# Patient Record
Sex: Male | Born: 1985 | Race: White | Hispanic: No | Marital: Married | Smoking: Former smoker
Health system: Southern US, Community
[De-identification: ages and names within clinical notes are randomized; demographics above are authoritative.]

## PROBLEM LIST (undated history)

## (undated) DIAGNOSIS — K5900 Constipation, unspecified: Secondary | ICD-10-CM

## (undated) DIAGNOSIS — G039 Meningitis, unspecified: Secondary | ICD-10-CM

## (undated) DIAGNOSIS — K219 Gastro-esophageal reflux disease without esophagitis: Secondary | ICD-10-CM

## (undated) HISTORY — DX: Meningitis, unspecified: G03.9

## (undated) HISTORY — DX: Constipation, unspecified: K59.00

## (undated) HISTORY — DX: Gastro-esophageal reflux disease without esophagitis: K21.9

---
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
## 2005-11-19 ENCOUNTER — Emergency Department (HOSPITAL_COMMUNITY): Admission: EM | Admit: 2005-11-19 | Discharge: 2005-11-20 | Payer: Self-pay | Admitting: Emergency Medicine

## 2005-11-26 ENCOUNTER — Emergency Department (HOSPITAL_COMMUNITY): Admission: EM | Admit: 2005-11-26 | Discharge: 2005-11-26 | Payer: Self-pay | Admitting: Emergency Medicine

## 2006-09-30 ENCOUNTER — Emergency Department (HOSPITAL_COMMUNITY): Admission: EM | Admit: 2006-09-30 | Discharge: 2006-09-30 | Payer: Self-pay | Admitting: Emergency Medicine

## 2010-10-13 ENCOUNTER — Other Ambulatory Visit: Payer: Self-pay | Admitting: Internal Medicine

## 2010-10-13 ENCOUNTER — Ambulatory Visit (HOSPITAL_COMMUNITY)
Admission: RE | Admit: 2010-10-13 | Discharge: 2010-10-13 | Disposition: A | Discharge status: Home / Self Care | Payer: 59 | Source: Ambulatory Visit | Attending: Internal Medicine | Admitting: Internal Medicine

## 2010-10-13 DIAGNOSIS — R079 Chest pain, unspecified: Secondary | ICD-10-CM | POA: Insufficient documentation

## 2010-10-13 DIAGNOSIS — F17200 Nicotine dependence, unspecified, uncomplicated: Secondary | ICD-10-CM | POA: Insufficient documentation

## 2011-02-11 LAB — CBC
HCT: 44.2
Hemoglobin: 15.1
MCV: 87.7
Platelets: 215
RBC: 5.04
RDW: 12.9
WBC: 13.3 — ABNORMAL HIGH

## 2011-02-11 LAB — DIFFERENTIAL
Basophils Absolute: 0.1
Lymphs Abs: 3.1
Neutrophils Relative %: 66

## 2011-09-19 ENCOUNTER — Ambulatory Visit (INDEPENDENT_AMBULATORY_CARE_PROVIDER_SITE_OTHER): Payer: 59 | Admitting: Family Medicine

## 2011-09-19 VITALS — BP 119/70 | HR 105 | Temp 98.8°F | Resp 18 | Ht 71.5 in | Wt 192.8 lb

## 2011-09-19 DIAGNOSIS — L02219 Cutaneous abscess of trunk, unspecified: Secondary | ICD-10-CM

## 2011-09-19 DIAGNOSIS — L02419 Cutaneous abscess of limb, unspecified: Secondary | ICD-10-CM

## 2011-09-19 DIAGNOSIS — L03119 Cellulitis of unspecified part of limb: Secondary | ICD-10-CM

## 2011-09-19 DIAGNOSIS — L03319 Cellulitis of trunk, unspecified: Secondary | ICD-10-CM

## 2011-09-19 DIAGNOSIS — R509 Fever, unspecified: Secondary | ICD-10-CM

## 2011-09-19 LAB — POCT CBC
Granulocyte percent: 70 %G (ref 37–80)
HCT, POC: 43.8 % (ref 43.5–53.7)
MCH, POC: 29.9 pg (ref 27–31.2)
MCHC: 33.1 g/dL (ref 31.8–35.4)
MID (cbc): 0.9 (ref 0–0.9)
MPV: 10.5 fL (ref 0–99.8)

## 2011-09-19 MED ORDER — HYDROCODONE-ACETAMINOPHEN 5-500 MG PO CAPS: 1.0000 | ORAL_CAPSULE | Freq: Three times a day (TID) | ORAL | Status: AC | PRN

## 2011-09-19 MED ORDER — DOXYCYCLINE HYCLATE 100 MG PO TABS: 100.0000 mg | ORAL_TABLET | Freq: Two times a day (BID) | ORAL | Status: AC

## 2011-09-19 MED ORDER — KETOROLAC TROMETHAMINE 60 MG/2ML IM SOLN
60.0000 mg | Freq: Once | INTRAMUSCULAR | Status: AC
  Administered 2011-09-19: 60 mg via INTRAMUSCULAR

## 2011-09-19 MED ORDER — CEFTRIAXONE SODIUM 1 G IJ SOLR
1.0000 g | Freq: Once | INTRAMUSCULAR | Status: AC
  Administered 2011-09-19: 1 g via INTRAMUSCULAR

## 2011-09-19 NOTE — Progress Notes (Signed)
  Subjective:    Patient ID: Ronald Clayton, male    DOB: 11-12-1985, 26 y.o.   MRN: [id number]  HPI  Patient presents with 4 days history of 3 tender lesions on (L) leg. Now with pain walking  Tdap 2008  Boils in the past  ?MRSA  Review of Systems     Objective:   Physical Exam  Constitutional: He appears well-developed.  Cardiovascular: Normal rate, regular rhythm and normal heart sounds.   Pulmonary/Chest: Effort normal and breath sounds normal.  Skin:       Abscess (L) groin, posterior thigh with diffuse erythema around lesions. Cellulitic area tattooed.         Assessment & Plan:   1. Cellulitis and abscess of lower leg  POCT CBC, cefTRIAXone (ROCEPHIN) injection 1 g, ketorolac (TORADOL) injection 60 mg, Tdap vaccine greater than or equal to 7yo IM, Wound culture   S/P I+ D; please refer to PA surgical note Follow up 24 hours; call overnight with concerns

## 2011-09-19 NOTE — Progress Notes (Signed)
  Subjective:    Patient ID: Ronald Clayton, male    DOB: [birth year], 26 y.o.   MRN: [id number]  HPI    Review of Systems     Objective:   Physical Exam  1)  Procedure:  L posterior upper thigh.  4 cc plain 2% lidocaine locally.  Sterile P&D.  #11 blade to make 2cm incision about 1.5cm deep.  Purulent drainage.  Culture taken.  Explored.  Some infection appears loculated in the dermis.  Generous amount of plain 1/4" packing into opening.  Bandaged and ACE wrap.  2)L inguinal region. 4cc plain 2% plain lidocaine locally.  1cm incision.  Small amount of purulent drainage. 1/4"plain guaze ~5cm packing then bandaged.        Assessment & Plan:  Recheck with Dr. Milus Glazier tomorrow

## 2011-09-20 ENCOUNTER — Telehealth: Payer: Self-pay

## 2011-09-20 ENCOUNTER — Ambulatory Visit (INDEPENDENT_AMBULATORY_CARE_PROVIDER_SITE_OTHER): Payer: 59 | Admitting: Family Medicine

## 2011-09-20 VITALS — BP 111/75 | HR 96 | Temp 99.1°F | Resp 18 | Ht 71.0 in | Wt 195.0 lb

## 2011-09-20 DIAGNOSIS — L0291 Cutaneous abscess, unspecified: Secondary | ICD-10-CM

## 2011-09-20 MED ORDER — CEFTRIAXONE SODIUM 1 G IJ SOLR
1.0000 g | Freq: Once | INTRAMUSCULAR | Status: AC
  Administered 2011-09-20: 1 g via INTRAMUSCULAR

## 2011-09-20 NOTE — Telephone Encounter (Signed)
PT WAS JUST SEEN BY DR Kenyon Ana AND HAVE QUESTIONS REGARDING HIS MEDS PLEASE CALL [phone]
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
## 2011-09-21 ENCOUNTER — Ambulatory Visit (INDEPENDENT_AMBULATORY_CARE_PROVIDER_SITE_OTHER): Payer: 59 | Admitting: Family Medicine

## 2011-09-21 VITALS — BP 127/73 | HR 76 | Temp 98.9°F | Resp 16 | Ht 72.0 in | Wt 191.4 lb

## 2011-09-21 DIAGNOSIS — L0291 Cutaneous abscess, unspecified: Secondary | ICD-10-CM

## 2011-09-21 DIAGNOSIS — L0390 Cellulitis, unspecified: Secondary | ICD-10-CM

## 2011-09-21 DIAGNOSIS — M25569 Pain in unspecified knee: Secondary | ICD-10-CM

## 2011-09-21 LAB — POCT CBC
Granulocyte percent: 65.2 %G (ref 37–80)
HCT, POC: 45.7 % (ref 43.5–53.7)
Hemoglobin: 15 g/dL (ref 14.1–18.1)
Lymph, poc: 3.2 (ref 0.6–3.4)
MCH, POC: 29.2 pg (ref 27–31.2)
MCHC: 32.8 g/dL (ref 31.8–35.4)
MCV: 89.1 fL (ref 80–97)
MID (cbc): 1.2 — AB (ref 0–0.9)
MPV: 10.4 fL (ref 0–99.8)
POC Granulocyte: 8.2 — AB (ref 2–6.9)
POC LYMPH PERCENT: 25.4 %L (ref 10–50)
POC MID %: 9.4 %M (ref 0–12)
Platelet Count, POC: 248 10*3/uL (ref 142–424)
RBC: 5.13 M/uL (ref 4.69–6.13)
RDW, POC: 12.7 %
WBC: 12.6 10*3/uL — AB (ref 4.6–10.2)

## 2011-09-21 MED ORDER — CEFTRIAXONE SODIUM 1 G IJ SOLR
1.0000 g | Freq: Once | INTRAMUSCULAR | Status: AC
  Administered 2011-09-21: 1 g via INTRAMUSCULAR

## 2011-09-21 NOTE — Telephone Encounter (Signed)
LMOM TO CB 

## 2011-09-21 NOTE — Telephone Encounter (Signed)
Pt  In office today and Rebekah left message for pt to contact her. Pt states he is good and doesn't need to speak with anyone anymore about his medicatioin thanks

## 2011-09-21 NOTE — Progress Notes (Signed)
  Subjective:    Patient ID: Ronald Clayton, male    DOB: Sep 20, 1985, 26 y.o.   MRN: [id number]  HPI  Patient presents in follow up of abscess X 3 associated with cellulitis.  Antibiotics- day #3 Doxycycline                    Day # 2 Rocephin  Still with significant (L) leg pain Denies fever/ chills or malaise  Review of Systems     Objective:   Physical Exam  Constitutional: He appears well-developed.  Neck: Neck supple.  Cardiovascular: Normal rate, regular rhythm and normal heart sounds.   Pulmonary/Chest: Effort normal and breath sounds normal.  Neurological: He is alert.  Skin:       erythema fading around tattooed areas (L) leg Lesion groin and posterior thigh draining  Lesion medial thigh soft and flucuant      Results for orders placed in visit on 09/21/11  POCT CBC      Component Value Range   WBC 12.6 (*) 4.6 - 10.2 (K/uL)   Lymph, poc 3.2  0.6 - 3.4    POC LYMPH PERCENT 25.4  10 - 50 (%L)   MID (cbc) 1.2 (*) 0 - 0.9    POC MID % 9.4  0 - 12 (%M)   POC Granulocyte 8.2 (*) 2 - 6.9    Granulocyte percent 65.2  37 - 80 (%G)   RBC 5.13  4.69 - 6.13 (M/uL)   Hemoglobin 15.0  14.1 - 18.1 (g/dL)   HCT, POC 64.4  03.4 - 53.7 (%)   MCV 89.1  80 - 97 (fL)   MCH, POC 29.2  27 - 31.2 (pg)   MCHC 32.8  31.8 - 35.4 (g/dL)   RDW, POC 74.2     Platelet Count, POC 248  142 - 424 (K/uL)   MPV 10.4  0 - 99.8 (fL)       Assessment & Plan:   1. Cellulitis and abscess of (L) groin and leg  POCT CBC, cefTRIAXone (ROCEPHIN) injection 1 g   I + D abscess (L) medial thigh; see PA surgical note Patient to follow up with Dr. Elbert Ewings in 2 days

## 2011-09-22 LAB — WOUND CULTURE

## 2011-09-22 NOTE — Progress Notes (Signed)
   Patient ID: DEAGLAN LILE MRN: [id number], DOB: [birth year] 26 y.o. Date of Encounter: 09/22/2011, 8:55 AM   PROCEDURE: Dressing and packing removed along posterior leg lesion as well as abdominal lesion. Moderate amount purulence expressed from both of the above lesions Wounds debrided to healthy wound bed Irrigated with 1% plain lidocaine 5 cc. Both lesions repacked with 1/4 plain packing Dressing applied    PROCEDURE NOTE: Verbal consent obtained. Betadine prep per usual protocol. Local anesthesia obtained with 2% plain lidocaine 3cc  1 cm incision made with 11 blade along lesion.  Culture previously taken. Moderate amount of purulence expressed. Wound debrided. Lesion explored revealing no loculations. Irrigated with 2% plain lidocaine. Packed with 1/4 inch plain packing Dressed. Wound care instructions including precautions with patient. Patient tolerated the procedure well. Recheck in 48 hours       Signed, Eula Listen, PA-C 09/22/2011 8:55 AM

## 2011-09-23 ENCOUNTER — Encounter: Payer: Self-pay | Admitting: Family Medicine

## 2011-09-23 ENCOUNTER — Ambulatory Visit (INDEPENDENT_AMBULATORY_CARE_PROVIDER_SITE_OTHER): Payer: 59 | Admitting: Physician Assistant

## 2011-09-23 VITALS — BP 121/74 | HR 84 | Temp 98.6°F | Resp 18 | Ht 71.0 in | Wt 194.0 lb

## 2011-09-23 DIAGNOSIS — L02419 Cutaneous abscess of limb, unspecified: Secondary | ICD-10-CM

## 2011-09-23 DIAGNOSIS — IMO0002 Reserved for concepts with insufficient information to code with codable children: Secondary | ICD-10-CM

## 2011-09-23 DIAGNOSIS — L0291 Cutaneous abscess, unspecified: Secondary | ICD-10-CM

## 2011-09-23 LAB — POCT CBC
HCT, POC: 44.3 % (ref 43.5–53.7)
Hemoglobin: 14.8 g/dL (ref 14.1–18.1)
POC Granulocyte: 4.2 (ref 2–6.9)
POC MID %: 8.5 %M (ref 0–12)
RBC: 4.96 M/uL (ref 4.69–6.13)
RDW, POC: 13.2 %
WBC: 8.1 10*3/uL (ref 4.6–10.2)

## 2011-09-23 NOTE — Progress Notes (Signed)
Patient ID: Ronald Clayton MRN: [id number], DOB: [birth year] 26 y.o. Date of Encounter: 09/23/2011, 9:41 AM  Primary Physician: No primary provider on file.  Chief Complaint: Wound care   See previous note  HPI: 26 y.o. y/o male presents for wound care s/p I&D of posterior leg lesion and abdominal lesion on 09/19/11 and medial thigh lesion I&D on 09/21/11. Doing well No issues or complaints Afebrile/ no chills No nausea or vomiting "I feel a lot better today." Tolerating Doxycycline Pain considerably improved Erythema has considerably improved Daily dressing change Previous note reviewed  No past medical history on file.   Home Meds: Prior to Admission medications   Medication Sig Start Date End Date Taking? Authorizing Provider  doxycycline (VIBRA-TABS) 100 MG tablet Take 1 tablet (100 mg total) by mouth 2 (two) times daily. 09/19/11 09/29/11 Yes Dois Davenport, MD  hydrocodone-acetaminophen (LORCET-HD) 5-500 MG per capsule Take 1 capsule by mouth every 8 (eight) hours as needed for pain. 09/19/11 09/29/11 Yes Dois Davenport, MD    Allergies: No Known Allergies  ROS: Constitutional: Afebrile, no chills Cardiovascular: negative for chest pain or palpitations Dermatological: Positive for wound. Negative for pain, or warmth  GI: No nausea or vomiting   EXAM: Physical Exam: Blood pressure 121/74, pulse 84, temperature 98.6 F (37 C), temperature source Oral, resp. rate 18, height 5\' 11"  (1.803 m), weight 194 lb (87.998 kg)., Body mass index is 27.06 kg/(m^2). General: Well developed, well nourished, in no acute distress. Nontoxic appearing. Head: Normocephalic, atraumatic, sclera non-icteric.  Neck: Supple. Lungs: Breathing is unlabored. Heart: Normal rate. Skin:  Warm and moist. Dressing and packing in place. Mild local induration with improving erythema. No tenderness to palpation. Neuro: Alert and oriented X 3. Moves all extremities spontaneously. Normal gait.    Psych:  Responds to questions appropriately with a normal affect.   PROCEDURE: Dressing and packing removed from medial thigh lesion, abdominal lesion, and posterior thigh lesion. No further purulence expressed from any of the above lesions Wound beds healthy in all three lesions Irrigated with 1% plain lidocaine 3cc in each lesion Repacked with 1/4 inch plain packing x 3 Dressing applied x 3  LAB: Culture: MRSA, sensitive to Doxycycline Results for orders placed in visit on 09/23/11  POCT CBC      Component Value Range   WBC 8.1  4.6 - 10.2 (K/uL)   Lymph, poc 3.2  0.6 - 3.4    POC LYMPH PERCENT 39.9  10 - 50 (%L)   MID (cbc) 0.7  0 - 0.9    POC MID % 8.5  0 - 12 (%M)   POC Granulocyte 4.2  2 - 6.9    Granulocyte percent 51.6  37 - 80 (%G)   RBC 4.96  4.69 - 6.13 (M/uL)   Hemoglobin 14.8  14.1 - 18.1 (g/dL)   HCT, POC 40.9  81.1 - 53.7 (%)   MCV 89.3  80 - 97 (fL)   MCH, POC 29.8  27 - 31.2 (pg)   MCHC 33.4  31.8 - 35.4 (g/dL)   RDW, POC 91.4     Platelet Count, POC 267  142 - 424 (K/uL)   MPV 10.5  0 - 99.8 (fL)    A/P: 26 y.o. y/o male with improving cellulitis/abscess as above s/p I&D on 09/19/11 and 09/21/11 -Wound care per above -Continue Doxycycline -Pain well controlled -Daily dressing changes -Recheck 48 hours  Signed, Eula Listen, PA-C 09/23/2011 9:41 AM

## 2011-09-25 ENCOUNTER — Ambulatory Visit (INDEPENDENT_AMBULATORY_CARE_PROVIDER_SITE_OTHER): Payer: 59 | Admitting: Physician Assistant

## 2011-09-25 VITALS — BP 112/68 | HR 83 | Temp 98.6°F | Resp 16 | Ht 71.5 in | Wt 193.0 lb

## 2011-09-25 DIAGNOSIS — IMO0002 Reserved for concepts with insufficient information to code with codable children: Secondary | ICD-10-CM

## 2011-09-25 DIAGNOSIS — K651 Peritoneal abscess: Secondary | ICD-10-CM

## 2011-09-25 DIAGNOSIS — L02419 Cutaneous abscess of limb, unspecified: Secondary | ICD-10-CM

## 2011-09-25 NOTE — Progress Notes (Signed)
   Patient ID: RUXIN RANSOME MRN: [id number], DOB: 04-Dec-1985 26 y.o. Date of Encounter: 09/25/2011, 3:47 PM  Primary Physician: No primary provider on file.  Chief Complaint: Wound care   See previous note  HPI: 26 y.o. y/o male presents for wound care s/p I&D of posterior leg lesion and abdominal lesion on 09/19/11 and medial thigh lesion I&D on 09/21/11. Doing well No issues or complaints Afebrile/ no chills "I feel 100% better." No nausea or vomiting Tolerating Doxycycline Pain resolved Daily dressing change Previous note reviewed  No past medical history on file.   Home Meds: Prior to Admission medications   Medication Sig Start Date End Date Taking? Authorizing Provider  doxycycline (VIBRA-TABS) 100 MG tablet Take 1 tablet (100 mg total) by mouth 2 (two) times daily. 09/19/11 09/29/11  Dois Davenport, MD  hydrocodone-acetaminophen (LORCET-HD) 5-500 MG per capsule Take 1 capsule by mouth every 8 (eight) hours as needed for pain. 09/19/11 09/29/11  Dois Davenport, MD    Allergies: No Known Allergies  ROS: Constitutional: Afebrile, no chills Cardiovascular: negative for chest pain or palpitations Dermatological: Positive for wound. Negative for erythema, pain, or warmth  GI: No nausea or vomiting   EXAM: Physical Exam: Blood pressure 112/68, pulse 83, temperature 98.6 F (37 C), temperature source Oral, resp. rate 16, height 5' 11.5" (1.816 m), weight 193 lb (87.544 kg)., Body mass index is 26.54 kg/(m^2). General: Well developed, well nourished, in no acute distress. Nontoxic appearing. Head: Normocephalic, atraumatic, sclera non-icteric.  Neck: Supple. Lungs: Breathing is unlabored. Heart: Normal rate. Skin:  Warm and moist. Dressing and packing in place. No induration, erythema, or tenderness to palpation. Neuro: Alert and oriented X 3. Moves all extremities spontaneously. Normal gait.  Psych:  Responds to questions appropriately with a normal affect.    PROCEDURE: Dressing and packing removed from medial thigh lesion, abdominal lesion, and posterior thigh lesion. No further purulence expressed from any of the above lesions Wound bed healthy Irrigated with 1% plain lidocaine 3 cc in each lesion. Repacked with small amount of 1/4 inch plain packing Dressing applied  LAB: Culture: MRSA, sensitive to Doxycycline  A/P: 26 y.o. y/o male with resolving cellulitis/abscess as above s/p I&D on 09/19/11 and 09/21/11 -Wound care per above -Continue Doxycycline -Pain well controlled -Daily dressing changes -Advance each packing by half an inch in 48 hours -Follow up 72 hours  Signed, Eula Listen, PA-C 09/25/2011 3:47 PM

## 2011-09-28 ENCOUNTER — Ambulatory Visit (INDEPENDENT_AMBULATORY_CARE_PROVIDER_SITE_OTHER): Payer: 59 | Admitting: Physician Assistant

## 2011-09-28 VITALS — BP 120/71 | HR 78 | Temp 98.6°F | Resp 16 | Ht 71.5 in | Wt 193.0 lb

## 2011-09-28 DIAGNOSIS — K651 Peritoneal abscess: Secondary | ICD-10-CM

## 2011-09-28 DIAGNOSIS — L03119 Cellulitis of unspecified part of limb: Secondary | ICD-10-CM

## 2011-09-28 DIAGNOSIS — IMO0002 Reserved for concepts with insufficient information to code with codable children: Secondary | ICD-10-CM

## 2011-09-28 DIAGNOSIS — L02419 Cutaneous abscess of limb, unspecified: Secondary | ICD-10-CM

## 2011-09-28 NOTE — Progress Notes (Signed)
   Patient ID: Ronald Clayton MRN: [id number], DOB: 08-30-1985 26 y.o. Date of Encounter: 09/28/2011, 5:34 PM  Primary Physician: No primary provider on file.  Chief Complaint: Wound care   See previous note  HPI: 26 y.o. y/o male presents for wound care s/p I&D of posterior leg lesion and abdominal lesion on 09/19/11 and medial thigh lesion I&D on 09/21/11. Doing well No issues or complaints Afebrile/ no chills No nausea or vomiting Tolerating Doxycycline Pain resolved Packing fell out of abdominal lesion the previous day during his dressing change Daily dressing change Previous note reviewed  No past medical history on file.   Home Meds: Prior to Admission medications   Medication Sig Start Date End Date Taking? Authorizing Provider  doxycycline (VIBRA-TABS) 100 MG tablet Take 1 tablet (100 mg total) by mouth 2 (two) times daily. 09/19/11 09/29/11  Dois Davenport, MD  hydrocodone-acetaminophen (LORCET-HD) 5-500 MG per capsule Take 1 capsule by mouth every 8 (eight) hours as needed for pain. 09/19/11 09/29/11  Dois Davenport, MD    Allergies: No Known Allergies  ROS: Constitutional: Afebrile, no chills Cardiovascular: negative for chest pain or palpitations Dermatological: Positive for wound. Negative for erythema, pain, or warmth  GI: No nausea or vomiting   EXAM: Physical Exam: Blood pressure 120/71, pulse 78, temperature 98.6 F (37 C), resp. rate 16, height 5' 11.5" (1.816 m), weight 193 lb (87.544 kg)., Body mass index is 26.54 kg/(m^2). General: Well developed, well nourished, in no acute distress. Nontoxic appearing. Head: Normocephalic, atraumatic, sclera non-icteric.  Neck: Supple. Lungs: Breathing is unlabored. Heart: Normal rate. Skin:  Warm and moist. Dressing and packing in place. No induration, erythema, or tenderness to palpation. Abdominal lesion resolved. Neuro: Alert and oriented X 3. Moves all extremities spontaneously. Normal gait.  Psych:   Responds to questions appropriately with a normal affect.   PROCEDURE: Dressing and packing removed. No purulence expressed from any lesion Wound bed healthy Irrigated with 1% plain lidocaine 5 cc. Repacked with small amount of 1/4 plain packing Dressing applied  LAB: Culture: MRSA  A/P: 26 y.o. y/o male with resolving cellulitis/abscess as above s/p I&D of posterior leg lesion and abdominal lesion 09/19/11 and medial thigh lesion I&D on 09/21/11. -Wound care per above -Continue Doxycycline -Remove packing from both leg lesions in 48 hours, call with update 48 hours later -Pain resolved -Daily dressing changes   Signed, Eula Listen, PA-C 09/28/2011 5:34 PM

## 2013-01-16 IMAGING — CR DG CHEST 2V
2 series · 2 of 2 positions shown · non-contrast
Comparison: None.

CLINICAL DATA: Chest pain.

CHEST - 2 VIEW

[view not recorded (1 of 2)]
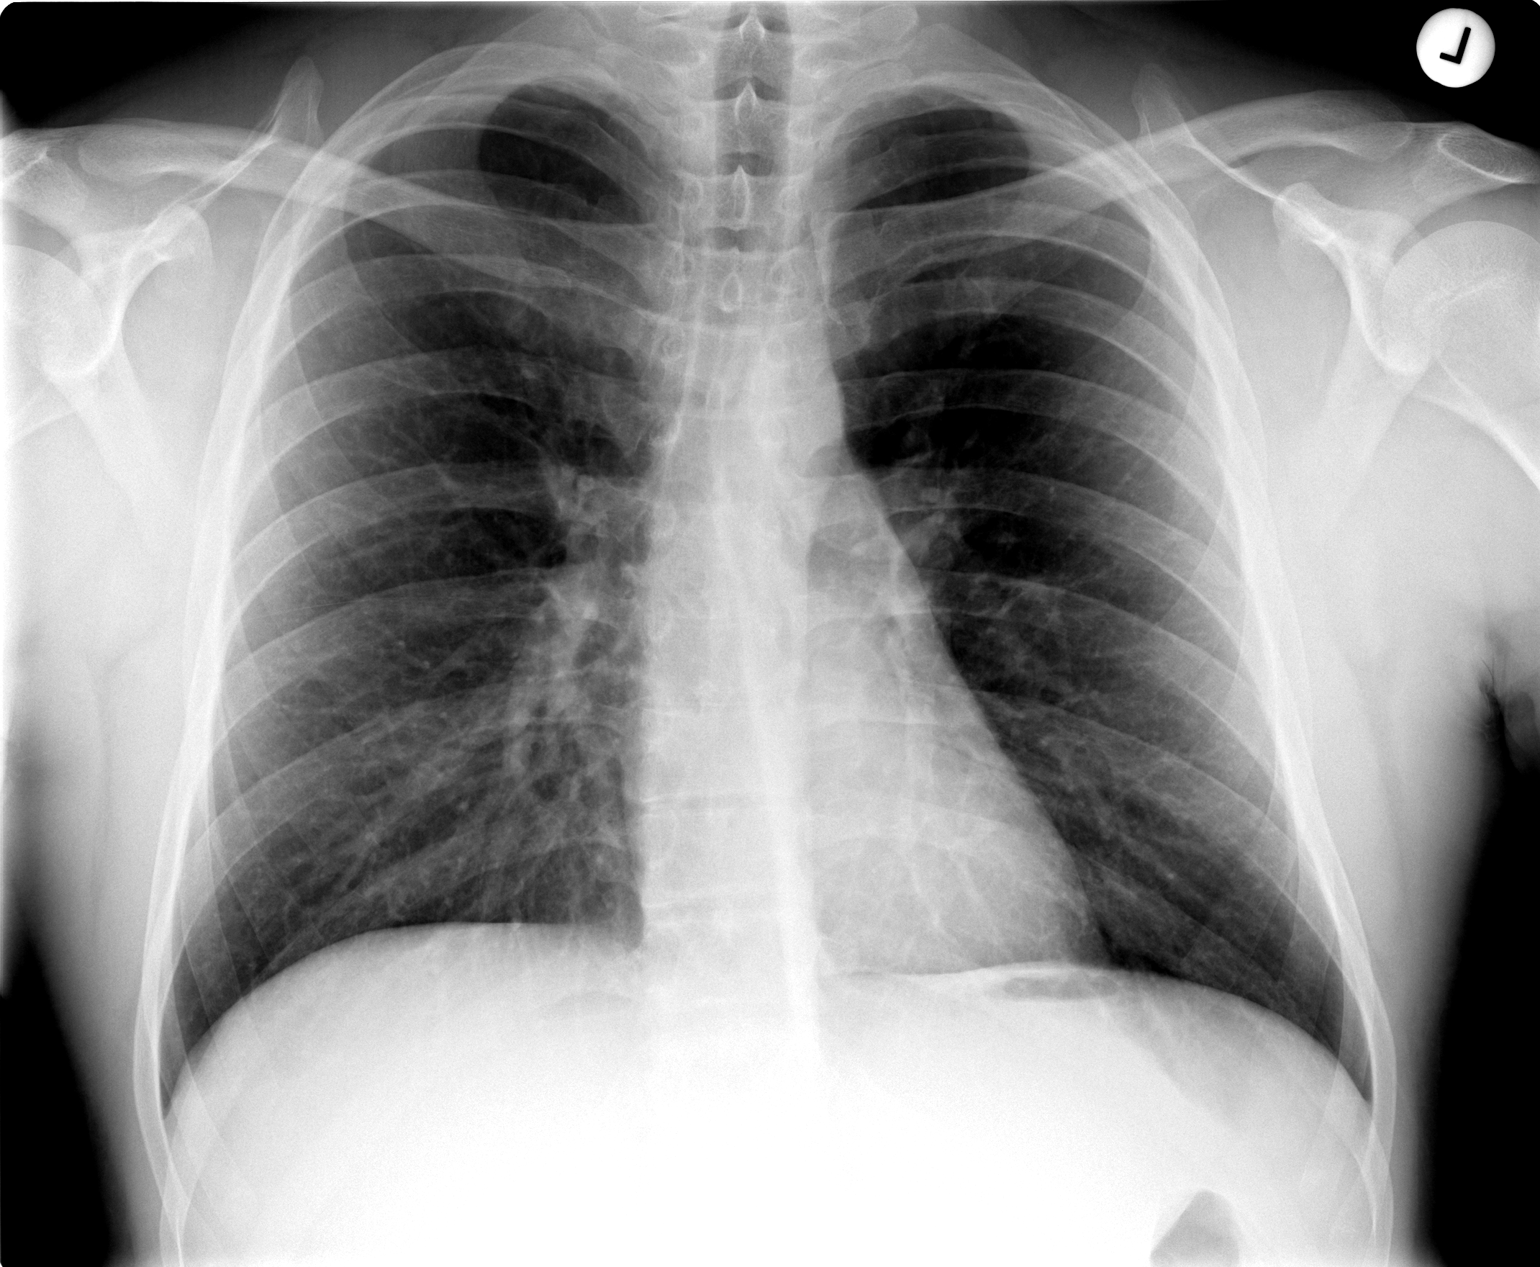

[view not recorded (2 of 2)]
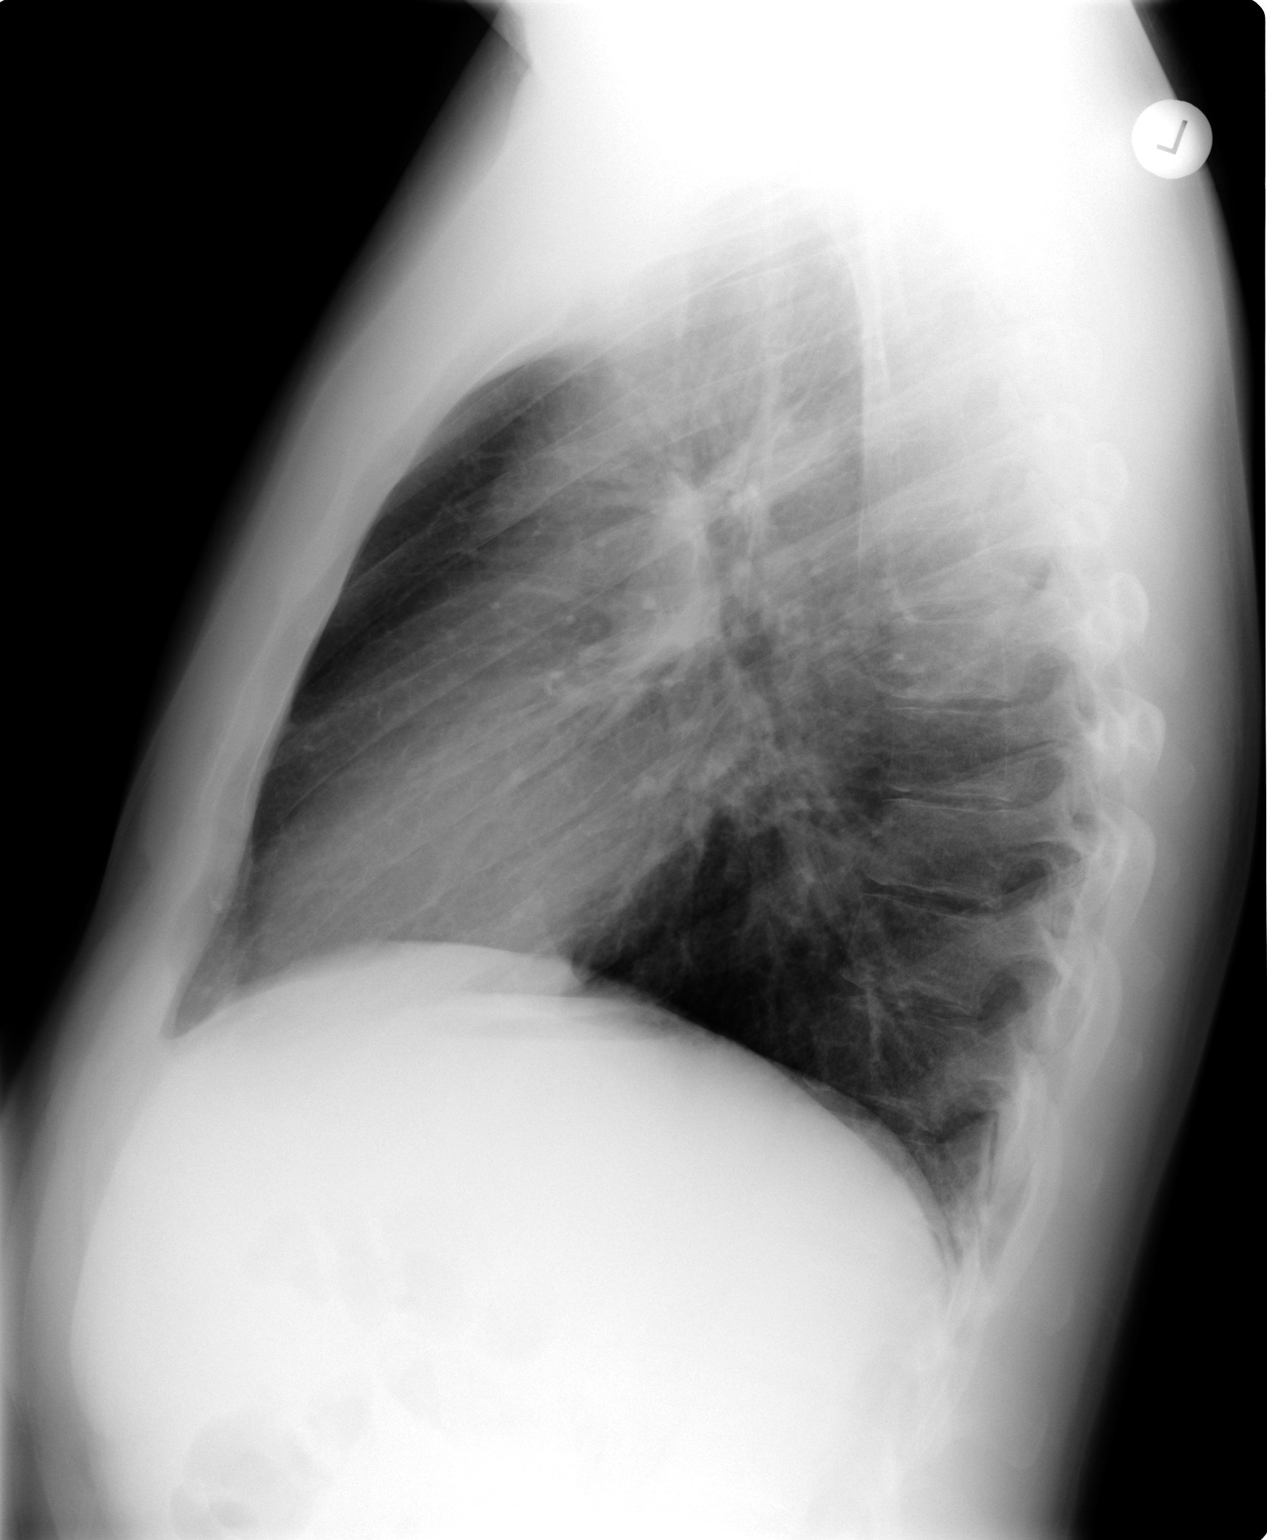

[2 of 2 positions shown; findings below may reference images not displayed]

FINDINGS: Normal cardiac and mediastinal silhouette.  Clear lung
fields.  No bony abnormality.
IMPRESSION: No active disease.

## 2015-12-15 ENCOUNTER — Other Ambulatory Visit (INDEPENDENT_AMBULATORY_CARE_PROVIDER_SITE_OTHER): Payer: 59

## 2015-12-15 ENCOUNTER — Encounter: Payer: Self-pay | Admitting: Nurse Practitioner

## 2015-12-15 ENCOUNTER — Ambulatory Visit (INDEPENDENT_AMBULATORY_CARE_PROVIDER_SITE_OTHER): Payer: 59 | Admitting: Nurse Practitioner

## 2015-12-15 VITALS — BP 122/84 | HR 80 | Temp 98.5°F | Ht 72.0 in | Wt 208.0 lb

## 2015-12-15 DIAGNOSIS — R079 Chest pain, unspecified: Secondary | ICD-10-CM | POA: Diagnosis not present

## 2015-12-15 DIAGNOSIS — Z309 Encounter for contraceptive management, unspecified: Secondary | ICD-10-CM

## 2015-12-15 DIAGNOSIS — Z0001 Encounter for general adult medical examination with abnormal findings: Secondary | ICD-10-CM

## 2015-12-15 DIAGNOSIS — Z8249 Family history of ischemic heart disease and other diseases of the circulatory system: Secondary | ICD-10-CM

## 2015-12-15 DIAGNOSIS — Z3009 Encounter for other general counseling and advice on contraception: Secondary | ICD-10-CM

## 2015-12-15 DIAGNOSIS — E2839 Other primary ovarian failure: Secondary | ICD-10-CM

## 2015-12-15 DIAGNOSIS — Z0000 Encounter for general adult medical examination without abnormal findings: Secondary | ICD-10-CM

## 2015-12-15 LAB — TSH: TSH: 1.52 u[IU]/mL (ref 0.35–4.50)

## 2015-12-15 LAB — LIPID PANEL
CHOL/HDL RATIO: 5
Cholesterol: 169 mg/dL (ref 0–200)
HDL: 36.9 mg/dL — ABNORMAL LOW (ref >39.00)
NONHDL: 132.49
Triglycerides: 213 mg/dL — ABNORMAL HIGH (ref 0.0–149.0)
VLDL: 42.6 mg/dL — ABNORMAL HIGH (ref 0.0–40.0)

## 2015-12-15 LAB — CBC WITH DIFFERENTIAL/PLATELET
BASOS ABS: 0.1 10*3/uL (ref 0.0–0.1)
Basophils Relative: 0.6 % (ref 0.0–3.0)
EOS PCT: 3.3 % (ref 0.0–5.0)
Eosinophils Absolute: 0.3 10*3/uL (ref 0.0–0.7)
HCT: 41.1 % (ref 39.0–52.0)
HEMOGLOBIN: 14.2 g/dL (ref 13.0–17.0)
Lymphocytes Relative: 41.4 % (ref 12.0–46.0)
Lymphs Abs: 3.9 10*3/uL (ref 0.7–4.0)
MCHC: 34.5 g/dL (ref 30.0–36.0)
MCV: 86.8 fl (ref 78.0–100.0)
MONO ABS: 0.7 10*3/uL (ref 0.1–1.0)
MONOS PCT: 7.4 % (ref 3.0–12.0)
NEUTROS PCT: 47.3 % (ref 43.0–77.0)
Neutro Abs: 4.4 10*3/uL (ref 1.4–7.7)
Platelets: 205 10*3/uL (ref 150.0–400.0)
RBC: 4.74 Mil/uL (ref 4.22–5.81)
RDW: 13.2 % (ref 11.5–15.5)
WBC: 9.4 10*3/uL (ref 4.0–10.5)

## 2015-12-15 LAB — BASIC METABOLIC PANEL
BUN: 15 mg/dL (ref 6–23)
CALCIUM: 8.8 mg/dL (ref 8.4–10.5)
CHLORIDE: 105 meq/L (ref 96–112)
CO2: 28 meq/L (ref 19–32)
Creatinine, Ser: 1.21 mg/dL (ref 0.40–1.50)
GFR: 74.43 mL/min (ref >60.00)
GLUCOSE: 90 mg/dL (ref 70–99)
Potassium: 3.9 mEq/L (ref 3.5–5.1)
SODIUM: 141 meq/L (ref 135–145)

## 2015-12-15 LAB — LDL CHOLESTEROL, DIRECT: Direct LDL: 114 mg/dL

## 2015-12-15 NOTE — Progress Notes (Signed)
Pre visit review using our clinic review tool, if applicable. No additional management support is needed unless otherwise documented below in the visit note. 

## 2015-12-15 NOTE — Progress Notes (Addendum)
Subjective:    Patient ID: Ronald SkeansMitchell A Rauber, male    DOB: March 30, 1986, 30 y.o.   MRN: [id number]  Chest Pain   This is a new problem. The current episode started more than 1 month ago. The onset quality is gradual. The problem occurs intermittently. The problem has been unchanged. The pain is present in the lateral region and substernal region. The pain is at a severity of 4/10. The pain is moderate. The quality of the pain is described as sharp. The pain does not radiate. Pertinent negatives include no abdominal pain, back pain, cough, dizziness, exertional chest pressure, headaches, irregular heartbeat, leg pain, lower extremity edema, malaise/fatigue, nausea, palpitations, shortness of breath, vomiting or weakness. The pain is aggravated by nothing. He has tried nothing for the symptoms. Risk factors include smoking/tobacco exposure and male gender.  His family medical history is significant for CAD.  Pertinent negatives for family medical history include: no diabetes.    Patient presents today for complete physical or establish care (new patient).  Immunizations: tetanus uptodate, decline influenza  HIV screening: declined today, last done 2001 (negative)  Diet: healthy for most part  Weight: noticed weight gain but does not think it's an issue right now. Wt Readings from Last 3 Encounters:  12/15/15 208 lb (94.3 kg)  09/28/11 193 lb (87.5 kg)  09/25/11 193 lb (87.5 kg)   Exercise: walking, swimming  Depression/anxiety: no depression,  no suicide ideation.  Vision: up to date.  Dental: will schedule.  Sexual History (birth control, marital status, STD): married, requesting referral to vasectomy, married, wife does not want any kids, wife has bad experience with birth control. Wife also had tubal pregnancy. No children at this time.  ETOH: 1beer per day  Tobacco use: yes, smokeless and vaping daily  Medications and allergies reviewed with patient and updated if  appropriate.  There are no active problems to display for this patient.   No current outpatient prescriptions on file prior to visit.   No current facility-administered medications on file prior to visit.     Past Medical History:  Diagnosis Date  . Meningitis spinal 1987    History reviewed. No pertinent surgical history.  Social History   Social History  . Marital status: Married    Spouse name: N/A  . Number of children: N/A  . Years of education: N/A   Social History Main Topics  . Smoking status: Light Tobacco Smoker    Packs/day: 1.00    Years: 10.00    Types: Cigarettes, E-cigarettes  . Smokeless tobacco: Current User    Types: Chew  . Alcohol use None  . Drug use: No  . Sexual activity: Yes    Birth control/ protection: None   Other Topics Concern  . None   Social History Narrative  . None    Family History  Problem Relation Age of Onset  . Hypertension Father   . Hypertension Maternal Grandfather     Review of Systems  Constitutional: Negative for activity change, appetite change, chills, fatigue, malaise/fatigue and unexpected weight change.  HENT: Negative.  Negative for congestion, hearing loss, postnasal drip, rhinorrhea, sinus pressure, sore throat and voice change.   Eyes: Negative for visual disturbance.  Respiratory: Negative for cough and shortness of breath.   Cardiovascular: Positive for chest pain. Negative for palpitations.       Intermittent spontaneous sharp chest pain, not related to any activity, no particular aggravating factor. [facility] Denies any  chest pain during OV.  Gastrointestinal: Negative for abdominal pain, blood in stool, constipation, diarrhea, nausea and vomiting.  Genitourinary: Negative for discharge, dysuria, penile pain, scrotal swelling, testicular pain and urgency.  Musculoskeletal: Negative for arthralgias, back pain and myalgias.  Skin: Negative.   Neurological: Negative for dizziness,  weakness and headaches.  Hematological: Negative for adenopathy.  Psychiatric/Behavioral: Negative for suicidal ideas. The patient is not nervous/anxious.    Recent Results (from the past 2160 hour(s))  TSH     Status: None   Collection Time: 12/15/15  3:09 PM  Result Value Ref Range   TSH 1.52 0.35 - 4.50 uIU/mL  Lipid Profile     Status: Abnormal   Collection Time: 12/15/15  3:09 PM  Result Value Ref Range   Cholesterol 169 0 - 200 mg/dL    Comment: ATP III Classification       Desirable:  < 200 mg/dL               Borderline High:  200 - 239 mg/dL          High:  > = 409 mg/dL   Triglycerides 811.9 (H) 0.0 - 149.0 mg/dL    Comment: Normal:  <147 mg/dLBorderline High:  150 - 199 mg/dL   HDL 82.95 (L) >62.13 mg/dL   VLDL 08.6 (H) 0.0 - 57.8 mg/dL   Total CHOL/HDL Ratio 5     Comment:                Men          Women1/2 Average Risk     3.4          3.3Average Risk          5.0          4.42X Average Risk          9.6          7.13X Average Risk          15.0          11.0                       NonHDL 132.49     Comment: NOTE:  Non-HDL goal should be 30 mg/dL higher than patient's LDL goal (i.e. LDL goal of < 70 mg/dL, would have non-HDL goal of < 100 mg/dL)  CBC w/Diff     Status: None   Collection Time: 12/15/15  3:09 PM  Result Value Ref Range   WBC 9.4 4.0 - 10.5 K/uL   RBC 4.74 4.22 - 5.81 Mil/uL   Hemoglobin 14.2 13.0 - 17.0 g/dL   HCT 46.9 62.9 - 52.8 %   MCV 86.8 78.0 - 100.0 fl   MCHC 34.5 30.0 - 36.0 g/dL   RDW 41.3 24.4 - 01.0 %   Platelets 205.0 150.0 - 400.0 K/uL   Neutrophils Relative % 47.3 43.0 - 77.0 %   Lymphocytes Relative 41.4 12.0 - 46.0 %   Monocytes Relative 7.4 3.0 - 12.0 %   Eosinophils Relative 3.3 0.0 - 5.0 %   Basophils Relative 0.6 0.0 - 3.0 %   Neutro Abs 4.4 1.4 - 7.7 K/uL   Lymphs Abs 3.9 0.7 - 4.0 K/uL   Monocytes Absolute 0.7 0.1 - 1.0 K/uL   Eosinophils Absolute 0.3 0.0 - 0.7 K/uL   Basophils Absolute 0.1 0.0 - 0.1 K/uL  LDL cholesterol,  direct     Status: None   Collection Time:  12/15/15  3:09 PM  Result Value Ref Range   Direct LDL 114.0 mg/dL    Comment: Optimal:  <161<100 mg/dLNear or Above Optimal:  100-129 mg/dLBorderline High:  130-159 mg/dLHigh:  160-189 mg/dLVery High:  >190 mg/dL  Basic metabolic panel     Status: None   Collection Time: 12/15/15  3:14 PM  Result Value Ref Range   Sodium 141 135 - 145 mEq/L   Potassium 3.9 3.5 - 5.1 mEq/L   Chloride 105 96 - 112 mEq/L   CO2 28 19 - 32 mEq/L   Glucose, Bld 90 70 - 99 mg/dL   BUN 15 6 - 23 mg/dL   Creatinine, Ser 0.961.21 0.40 - 1.50 mg/dL   Calcium 8.8 8.4 - 04.510.5 mg/dL   GFR 40.9874.43 >11.91>60.00 mL/min      Objective:   Vitals:   12/15/15 1415  BP: 122/84  Pulse: 80  Temp: 98.5 F (36.9 C)    Filed Weights   12/15/15 1415  Weight: 208 lb (94.3 kg)    Body mass index is 28.21 kg/m.   ECG today indicates NSR with non specific ST changes.  Estimated 9850yrs risk of MI or CV death is 4%.   Physical Exam  Constitutional: He is oriented to person, place, and time. He appears well-nourished.  HENT:  Right Ear: External ear normal.  Left Ear: External ear normal.  Nose: Nose normal.  Mouth/Throat: Oropharynx is clear and moist.  Eyes: No scleral icterus.  Neck: Normal range of motion. Neck supple. No thyromegaly present.  Cardiovascular: Normal rate and normal heart sounds.   Pulmonary/Chest: Effort normal and breath sounds normal. He exhibits no tenderness.  Abdominal: Soft. Bowel sounds are normal. He exhibits no distension. There is no tenderness.  Musculoskeletal: Normal range of motion. He exhibits no edema.  Lymphadenopathy:    He has no cervical adenopathy.  Neurological: He is alert and oriented to person, place, and time.  Skin: Skin is warm and dry.  Psychiatric: He has a normal mood and affect. His behavior is normal. Thought content normal.  Vitals reviewed.   ASSESSMENT and PLAN Clovis RileyMitchell was seen today for establish care.  Diagnoses and  all orders for this visit:  Encounter for general adult medical examination with abnormal findings -     EKG 12-Lead -     COMPLETE METABOLIC PANEL WITH GFR; Future -     TSH; Future -     Lipid Profile; Future -     CBC w/Diff; Future  Chest pain, unspecified chest pain type -     Exercise Tolerance Test; Future -     COMPLETE METABOLIC PANEL WITH GFR; Future -     TSH; Future -     Lipid Profile; Future -     CBC w/Diff; Future  Family history of premature CAD -     Exercise Tolerance Test; Future -     COMPLETE METABOLIC PANEL WITH GFR; Future -     TSH; Future -     Lipid Profile; Future -     CBC w/Diff; Future  Family planning education, guidance, and counseling -     Ambulatory referral to Urology  Follow up in 85month for tobacco cessation counselling.

## 2015-12-16 ENCOUNTER — Telehealth: Payer: Self-pay

## 2015-12-16 NOTE — Telephone Encounter (Signed)
HDL <40 is low for his age. Regular exercise, and healthy diet should help increase this level. No medication is needed at this time. We will repeat level in 6months. He should wait till after stress test before we decide on taking baby aspirin on a daily basis.

## 2015-12-16 NOTE — Telephone Encounter (Signed)
Patient states that his HDL is 36 and he sees that on the chart that is high. He wanted to know if he should be doing anything for this?  Also he states that he wanted to know if he should be taking a daily baby aspin because of the history in his family with heart disease.  Please advise and I can follow up, Thank you.

## 2015-12-16 NOTE — Telephone Encounter (Signed)
Called patient back and informed him of the notes. He understood. Thank you.

## 2015-12-24 ENCOUNTER — Ambulatory Visit (INDEPENDENT_AMBULATORY_CARE_PROVIDER_SITE_OTHER): Payer: 59

## 2015-12-24 DIAGNOSIS — R079 Chest pain, unspecified: Secondary | ICD-10-CM

## 2015-12-24 DIAGNOSIS — Z8249 Family history of ischemic heart disease and other diseases of the circulatory system: Secondary | ICD-10-CM | POA: Diagnosis not present

## 2015-12-24 LAB — EXERCISE TOLERANCE TEST
CHL CUP MPHR: 190 {beats}/min
CHL CUP RESTING HR STRESS: 74 {beats}/min
CSEPPHR: 179 {beats}/min
Estimated workload: 13.4 METS
Exercise duration (min): 12 min
Exercise duration (sec): 0 s
Percent HR: 94 %
RPE: 15

## 2016-03-09 ENCOUNTER — Ambulatory Visit: Payer: 59 | Admitting: Nurse Practitioner

## 2016-03-15 ENCOUNTER — Encounter: Payer: Self-pay | Admitting: Nurse Practitioner

## 2016-03-15 DIAGNOSIS — K219 Gastro-esophageal reflux disease without esophagitis: Secondary | ICD-10-CM

## 2016-03-15 DIAGNOSIS — K5900 Constipation, unspecified: Secondary | ICD-10-CM

## 2016-03-15 DIAGNOSIS — Z86010 Personal history of colonic polyps: Secondary | ICD-10-CM | POA: Insufficient documentation

## 2016-03-15 HISTORY — DX: Gastro-esophageal reflux disease without esophagitis: K21.9

## 2016-03-15 HISTORY — DX: Constipation, unspecified: K59.00

## 2019-04-30 ENCOUNTER — Ambulatory Visit: Payer: 59 | Attending: Internal Medicine

## 2019-04-30 DIAGNOSIS — Z20822 Contact with and (suspected) exposure to covid-19: Secondary | ICD-10-CM

## 2019-05-01 ENCOUNTER — Encounter: Payer: Self-pay | Admitting: *Deleted

## 2019-05-01 ENCOUNTER — Ambulatory Visit: Payer: Self-pay | Admitting: *Deleted

## 2019-05-01 LAB — NOVEL CORONAVIRUS, NAA: SARS-CoV-2, NAA: DETECTED — AB

## 2019-05-01 NOTE — Telephone Encounter (Signed)
This encounter was created in error - please disregard.

## 2019-05-01 NOTE — Telephone Encounter (Signed)
Pt called to ask what could be done for brain fog. He tested positive or covid 19 and stated that he noted that he has some brain fog. Advised that some of the patients do experience that but he should notify his pcp and get advice if any for this. Also he had a question in regards to being active or just laying around. Advised him to get his rest, hydrate and fresh air. He voiced understanding. Routing to [facility] at [place] for review and recommendations.

## 2019-05-02 NOTE — Telephone Encounter (Signed)
Please contact patient to schedule virtual appt. Thank you

## 2019-05-02 NOTE — Telephone Encounter (Signed)
Pt is aware of message below.   I also advised the pt to see Claris Gower at least once  a 1 or 2 year for CPE to main relationship with the provider.

## 2019-05-02 NOTE — Telephone Encounter (Signed)
Spoke with the pt, he said that he is doing okey,congestion,cough and fatigue is a lot better now, the only thing he is concern is not being able to concentrate like he was before,  he was just wondering if he need to do something about it or this will go away on its own (not sure if this is side effect of Covid).   Pt denied of appt at the moment--does not want to take up [place] time.

## 2019-05-02 NOTE — Telephone Encounter (Signed)
Follow instructions provided by triage nurse. I am unable to provided addition recommendation without getting a complete assessment on other possible causes.

## 2019-05-02 NOTE — Telephone Encounter (Signed)
Charlotte please advise, FYI--this pt has not seen you since 12/15/2015.

## 2021-02-16 ENCOUNTER — Other Ambulatory Visit: Payer: Self-pay

## 2021-02-16 ENCOUNTER — Encounter: Payer: Self-pay | Admitting: Nurse Practitioner

## 2021-02-16 ENCOUNTER — Ambulatory Visit (INDEPENDENT_AMBULATORY_CARE_PROVIDER_SITE_OTHER): Payer: Managed Care, Other (non HMO) | Admitting: Nurse Practitioner

## 2021-02-16 VITALS — BP 130/70 | HR 82 | Temp 97.0°F | Ht 71.5 in | Wt 208.4 lb

## 2021-02-16 DIAGNOSIS — B351 Tinea unguium: Secondary | ICD-10-CM | POA: Diagnosis not present

## 2021-02-16 DIAGNOSIS — B353 Tinea pedis: Secondary | ICD-10-CM | POA: Diagnosis not present

## 2021-02-16 DIAGNOSIS — R41840 Attention and concentration deficit: Secondary | ICD-10-CM | POA: Diagnosis not present

## 2021-02-16 DIAGNOSIS — Z8249 Family history of ischemic heart disease and other diseases of the circulatory system: Secondary | ICD-10-CM | POA: Insufficient documentation

## 2021-02-16 MED ORDER — TERBINAFINE HCL 250 MG PO TABS: 250.0000 mg | ORAL_TABLET | Freq: Every day | ORAL | 0 refills | Status: DC

## 2021-02-16 MED ORDER — ATOMOXETINE HCL 40 MG PO CAPS: 40.0000 mg | ORAL_CAPSULE | Freq: Every day | ORAL | 1 refills | Status: AC

## 2021-02-16 NOTE — Patient Instructions (Addendum)
You will be contacted to schedule appt with psychiatry. Start straterra 40mg .  Go to lab for blood draw  No alcohol consumption while taking lamisil. Soak feet in warm water and vinegar daily. Return to lab in 56month for repeat hepatic function test.  Fungal Nail Infection A fungal nail infection is a common infection of the toenails or fingernails. This condition affects toenails more often than fingernails. It often affects the great, or big, toes. More than one nail may be infected. The condition can be passed from person to person (is contagious). What are the causes? This condition is caused by a fungus, such as yeast or molds. Several types of fungi can cause the infection. These fungi are common in moist and warm areas. If your hands or feet come into contact with the fungus, it may get into a crack in your fingernail or toenail or in the surrounding skin, and cause an infection. What increases the risk? The following factors may make you more likely to develop this condition: Being of older age. Having certain medical conditions, such as: Athlete's foot. Diabetes. Poor circulation. A weak body defense system (immune system). Walking barefoot in areas where the fungus thrives, such as showers or locker rooms. Wearing shoes and socks that cause your feet to sweat. Having a nail injury or a recent nail surgery. What are the signs or symptoms? Symptoms of this condition include: A pale spot on the nail. Thickening of the nail. A nail that becomes yellow, brown, or white. A brittle or ragged nail edge. A nail that has lifted away from the nail bed. How is this diagnosed? This condition is diagnosed with a physical exam. Your health care provider may take a scraping or clipping from your nail to test for the fungus. How is this treated? Treatment is not needed for mild infections. If you have significant nail changes, treatment may include: Antifungal medicines taken by mouth  (orally). You may need to take the medicine for several weeks or several months, and you may not see the results for a long time. These medicines can cause side effects. Ask your health care provider what problems to watch for. Antifungal nail polish or nail cream. These may be used along with oral antifungal medicines. Laser treatment of the nail. Surgery to remove the nail. This may be needed for the most severe infections. It can take a long time, usually up to a year, for the infection to go away. The infection may also come back. Follow these instructions at home: Medicines Take or apply over-the-counter and prescription medicines only as told by your health care provider. Ask your health care provider about using over-the-counter mentholated ointment on your nails. Nail care Trim your nails often. Wash and dry your hands and feet every day. Keep your feet dry. To do this: Wear absorbent socks, and change your socks frequently. Wear shoes that allow air to circulate, such as sandals or canvas tennis shoes. Throw out old shoes. If you go to a nail salon, make sure you choose one that uses clean instruments. Use antifungal foot powder on your feet and in your shoes. General instructions Do not share personal items, such as towels or nail clippers. Do not walk barefoot in shower rooms or locker rooms. Wear rubber gloves if you are working with your hands in wet areas. Keep all follow-up visits. This is important. Contact a health care provider if: You have redness, pain, or pus near the toenail or fingernail. Your infection  is not getting better, or it is getting worse after several months. You have more circulation problems near the toenail or fingernail. You have brown or black discoloration of the nail that spreads to the surrounding skin. Summary A fungal nail infection is a common infection of the toenails or fingernails. Treatment is not needed for mild infections. If you have  significant nail changes, treatment may include taking medicine orally and applying medicine to your nails. It can take a long time, usually up to a year, for the infection to go away. The infection may also come back. Take or apply over-the-counter and prescription medicines only as told by your health care provider. This information is not intended to replace advice given to you by your health care provider. Make sure you discuss any questions you have with your health care provider. Document Revised: 07/14/2020 Document Reviewed: 07/14/2020 Elsevier Patient Education  2022 [employer]   Living With Attention Deficit Hyperactivity Disorder If you have been diagnosed with attention deficit hyperactivity disorder (ADHD), you may be relieved that you now know why you have felt or behaved a certain way. Still, you may feel overwhelmed about the treatment ahead. You may also wonder how to get the support you need and how to deal with the condition day-to-day. With treatment and support, you can live with ADHD and manage your symptoms. How to manage lifestyle changes Managing stress Stress is your body's reaction to life changes and events, both good and bad. To cope with the stress of an ADHD diagnosis, it may help to: Learn more about ADHD. Exercise regularly. Even a short daily walk can lower stress levels. Participate in training or education programs (including social skills training classes) that teach you to deal with symptoms.  Medicines Your health care provider may suggest certain medicines if he or she feels that they will help to improve your condition. Stimulant medicines are usually prescribed to treat ADHD, and therapy may also be prescribed. It is important to: Avoid using alcohol and other substances that may prevent your medicines from working properly (may interact). Talk with your pharmacist or health care provider about all the medicines that you take, their possible side  effects, and what medicines are safe to take together. Make it your goal to take part in all treatment decisions (shared decision-making). Ask about possible side effects of medicines that your health care provider recommends, and tell him or her how you feel about having those side effects. It is best if shared decision-making with your health care provider is part of your total treatment plan. Relationships To strengthen your relationships with family members while treating your condition, consider taking part in family therapy. You might also attend self-help groups alone or with a loved one. Be honest about how your symptoms affect your relationships. Make an effort to communicate respectfully instead of fighting, and find ways to show others that you care. Psychotherapy may be useful in helping you cope with how ADHD affects your relationships. How to recognize changes in your condition The following signs may mean that your treatment is working well and your condition is improving: Consistently being on time for appointments. Being more organized at home and work. Other people noticing improvements in your behavior. Achieving goals that you set for yourself. Thinking more clearly. The following signs may mean that your treatment is not working very well: Feeling impatience or more confusion. Missing, forgetting, or being late for appointments. An increasing sense of disorganization and messiness. More  difficulty in reaching goals that you set for yourself. Loved ones becoming angry or frustrated with you. Follow these instructions at home: Take over-the-counter and prescription medicines only as told by your health care provider. Check with your health care provider before taking any new medicines. Create structure and an organized atmosphere at home. For example: Make a list of tasks, then rank them from most important to least important. Work on one task at a time until your listed tasks  are done. Make a daily schedule and follow it consistently every day. Use an appointment calendar, and check it 2 or 3 times a day to keep on track. Keep it with you when you leave the house. Create spaces where you keep certain things, and always put things back in their places after you use them. Keep all follow-up visits as told by your health care provider. This is important. Where to find support Talking to others  Keep emotion out of important discussions and speak in a calm, logical way. Listen closely and patiently to your loved ones. Try to understand their point of view, and try to avoid getting defensive. Take responsibility for the consequences of your actions. Ask that others do not take your behaviors personally. Aim to solve problems as they come up, and express your feelings instead of bottling them up. Talk openly about what you need from your loved ones and how they can support you. Consider going to family therapy sessions or having your family meet with a specialist who deals with ADHD-related behavior problems. Finances Not all insurance plans cover mental health care, so it is important to check with your insurance carrier. If paying for co-pays or counseling services is a problem, search for a local or county mental health care center. Public mental health care services may be offered there at a low cost or no cost when you are not able to see a private health care provider. If you are taking medicine for ADHD, you may be able to get the generic form, which may be less expensive than brand-name medicine. Some makers of prescription medicines also offer help to patients who cannot afford the medicines that they need. Questions to ask your health care provider: What are the risks and benefits of taking medicines? Would I benefit from therapy? How often should I follow up with a health care provider? Contact a health care provider if: You have side effects from your  medicines, such as: Repeated muscle twitches, coughing, or speech outbursts. Sleep problems. Loss of appetite. Depression. New or worsening behavior problems. Dizziness. Unusually fast heartbeat. Stomach pains. Headaches. Get help right away if: You have a severe reaction to a medicine. Your behavior suddenly gets worse. Summary With treatment and support, you can live with ADHD and manage your symptoms. The medicines that are most often prescribed for ADHD are stimulants. Consider taking part in family therapy or self-help groups with family members or friends. When you talk with friends and family about your ADHD, be patient and communicate openly. Take over-the-counter and prescription medicines only as told by your health care provider. Check with your health care provider before taking any new medicines. This information is not intended to replace advice given to you by your health care provider. Make sure you discuss any questions you have with your health care provider. Document Revised: 09/26/2019 Document Reviewed: 09/26/2019 Elsevier Patient Education  2022 [employer].

## 2021-02-16 NOTE — Progress Notes (Signed)
Subjective:  Patient ID: Ronald Clayton, male    DOB: 1985/08/11  Age: 35 y.o. MRN: [id number]  CC: Establish Care (New patient/Pt would like to discuss possibly starting medication for ADHD and foot fungus. /Pt states the concerns with his feet has been going on since high school. )  HPI Reports bilateral toenails fungal infection x several years, associated with intermittent pain. Nails are discolored, thick and brittle. No improvement with topical agents.  Attention and concentration deficit Reports he was diagnosed with ADHD at age of 74, no medication used at the time. Does not remember if behavioral therapy was completed at the time. States he was prescribed Adderall in 2010-2012 when he lived in [place]. Does not remember why medication was discontinued. 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 He now has difficulty with completing tasks. He reports current job is not as demanding as previous job. Reports he is easily distracted. He did not have an issue while working in the office. He admits improvement in completing task when he makes a list and/or plans his day. He admits to using his wife's adderall prescription (1/4tab daily). He reports improved attention and contraception with 1/4tab of adderall from his wife.  Entered referral to psychiatry for additional eval. Declined referral to psychology for cognitive behavioral therapy at this time. Advised not to use wife's prescription. Advised to try using a daily planner to help stay of task. If no improvement, start Strattera. F/up in 1-75months  Depression screen PHQ 2/9 02/16/2021  Decreased Interest 1  Down, Depressed, Hopeless 1  PHQ - 2 Score 2  Altered sleeping 1  Tired, decreased energy 2  Change in appetite 0  Feeling bad or failure about yourself  0  Trouble concentrating 3  Moving slowly or fidgety/restless 1  Suicidal thoughts 0  PHQ-9 Score 9   Difficult doing work/chores Very difficult    GAD 7 : Generalized Anxiety Score 02/16/2021  Nervous, Anxious, on Edge 2  Control/stop worrying 2  Worry too much - different things 2  Trouble relaxing 3  Restless 3  Easily annoyed or irritable 2  Afraid - awful might happen 0  Total GAD 7 Score 14  Anxiety Difficulty Very difficult   Reviewed past Medical, Social and Family history today.  No outpatient medications prior to visit.   No facility-administered medications prior to visit.   ROS See HPI  Objective:  BP 130/70 (BP Location: Left Arm, Patient Position: Sitting, Cuff Size: Large)   Pulse 82   Temp (!) 97 F (36.1 C) (Temporal)   Ht 5' 11.5" (1.816 m)   Wt 208 lb 6.4 oz (94.5 kg)   SpO2 98%   BMI 28.66 kg/m   Physical Exam Cardiovascular:     Pulses:          Dorsalis pedis pulses are 2+ on the right side and 2+ on the left side.       Posterior tibial pulses are 2+ on the right side and 2+ on the left side.  Feet:     Right foot:     Skin integrity: No skin breakdown, erythema or warmth.     Toenail Condition: Right toenails are abnormally thick. Fungal disease present.    Left foot:     Skin integrity: No skin breakdown, erythema or warmth.     Toenail Condition: Left toenails are abnormally thick. Fungal disease present.    Comments: Macerated  skin between toes Neurological:     Mental Status: He is alert.  Psychiatric:        Attention and Perception: Attention normal.        Mood and Affect: Mood is anxious.        Speech: Speech normal.        Behavior: Behavior is agitated. Behavior is cooperative.        Thought Content: Thought content normal.        Cognition and Memory: Cognition and memory normal.        Judgment: Judgment normal.   Assessment & Plan:  This visit occurred during the SARS-CoV-2 public health emergency.  Safety protocols were in place, including screening questions prior to the visit, additional usage of staff PPE, and  extensive cleaning of exam room while observing appropriate contact time as indicated for disinfecting solutions.   Ronald Clayton was seen today for establish care.  Diagnoses and all orders for this visit:  Onychomycosis -     Comprehensive metabolic panel -     terbinafine (LAMISIL) 250 MG tablet; Take 1 tablet (250 mg total) by mouth daily.  Attention and concentration deficit -     Ambulatory referral to Psychiatry -     atomoxetine (STRATTERA) 40 MG capsule; Take 1 capsule (40 mg total) by mouth daily. With breakfast  Tinea pedis of both feet -     terbinafine (LAMISIL) 250 MG tablet; Take 1 tablet (250 mg total) by mouth daily.  No alcohol consumption while taking lamisil. Soak feet in warm water and vinegar daily. Return to lab in 44month for repeat hepatic function test.  Problem List Items Addressed This Visit       Other   Attention and concentration deficit    Reports he was diagnosed with ADHD at age of 60, no medication used at the time. Does not remember if behavioral therapy was completed at the time. States he was prescribed Adderall in 2010-2012 when he lived in [place]. Does not remember why medication was discontinued. 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 He now has difficulty with completing tasks. He reports current job is not as demanding as previous job. Reports he is easily distracted. He did not have an issue while working in the office. He admits improvement in completing task when he makes a list and/or plans his day. He admits to using his wife's adderall prescription (1/4tab daily). He reports improved attention and contraception with 1/4tab of adderall from his wife.  Entered referral to psychiatry for additional eval. Declined referral to psychology for cognitive behavioral therapy at this time. Advised not to use wife's prescription. Advised to try using a daily planner to help stay of task. If  no improvement, start Strattera. F/up in 1-62months      Relevant Medications   atomoxetine (STRATTERA) 40 MG capsule   Other Relevant Orders   Ambulatory referral to Psychiatry   Other Visit Diagnoses     Onychomycosis    -  Primary   Relevant Medications   terbinafine (LAMISIL) 250 MG tablet   Other Relevant Orders   Comprehensive metabolic panel   Tinea pedis of both feet       Relevant Medications   terbinafine (LAMISIL) 250 MG tablet       Follow-up: Return in about 3 months (around 05/19/2021) for CPE (fasting).  Ronald Penna, NP

## 2021-02-16 NOTE — Assessment & Plan Note (Addendum)
Reports he was diagnosed with ADHD at age of 55, no medication used at the time. Does not remember if behavioral therapy was completed at the time. States he was prescribed Adderall in 2010-2012 when he lived in [place]. Does not remember why medication was discontinued. 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 He now has difficulty with completing tasks. He reports current job is not as demanding as previous job. Reports he is easily distracted. He did not have an issue while working in the office. He admits improvement in completing task when he makes a list and/or plans his day. He admits to using his wife's adderall prescription (1/4tab daily). He reports improved attention and contraception with 1/4tab of adderall from his wife.  Entered referral to psychiatry for additional eval. Declined referral to psychology for cognitive behavioral therapy at this time. Advised not to use wife's prescription. Advised to try using a daily planner to help stay of task. If no improvement, start Strattera. F/up in 1-4months

## 2021-02-17 LAB — COMPREHENSIVE METABOLIC PANEL
ALT: 27 U/L (ref 0–53)
AST: 20 U/L (ref 0–37)
Albumin: 4.5 g/dL (ref 3.5–5.2)
Alkaline Phosphatase: 57 U/L (ref 39–117)
BUN: 10 mg/dL (ref 6–23)
CO2: 30 mEq/L (ref 19–32)
Calcium: 9.7 mg/dL (ref 8.4–10.5)
Chloride: 102 mEq/L (ref 96–112)
Creatinine, Ser: 1.19 mg/dL (ref 0.40–1.50)
GFR: 78.87 mL/min (ref >60.00)
Glucose, Bld: 93 mg/dL (ref 70–99)
Potassium: 4.3 mEq/L (ref 3.5–5.1)
Sodium: 138 mEq/L (ref 135–145)
Total Bilirubin: 0.9 mg/dL (ref 0.2–1.2)
Total Protein: 7.3 g/dL (ref 6.0–8.3)

## 2021-03-17 ENCOUNTER — Telehealth: Payer: Self-pay | Admitting: Nurse Practitioner

## 2021-03-26 NOTE — Telephone Encounter (Signed)
Lab appointment scheduled for 03/27/21

## 2021-03-27 ENCOUNTER — Other Ambulatory Visit (INDEPENDENT_AMBULATORY_CARE_PROVIDER_SITE_OTHER): Payer: Managed Care, Other (non HMO)

## 2021-03-27 ENCOUNTER — Other Ambulatory Visit: Payer: Self-pay

## 2021-03-27 DIAGNOSIS — B353 Tinea pedis: Secondary | ICD-10-CM

## 2021-03-27 DIAGNOSIS — B351 Tinea unguium: Secondary | ICD-10-CM | POA: Diagnosis not present

## 2021-03-27 LAB — HEPATIC FUNCTION PANEL
ALT: 30 U/L (ref 0–53)
AST: 21 U/L (ref 0–37)
Albumin: 4.4 g/dL (ref 3.5–5.2)
Alkaline Phosphatase: 54 U/L (ref 39–117)
Bilirubin, Direct: 0.1 mg/dL (ref 0.0–0.3)
Total Bilirubin: 0.6 mg/dL (ref 0.2–1.2)
Total Protein: 7.2 g/dL (ref 6.0–8.3)

## 2021-03-27 MED ORDER — TERBINAFINE HCL 250 MG PO TABS: 250.0000 mg | ORAL_TABLET | Freq: Every day | ORAL | 0 refills | Status: DC

## 2021-03-27 NOTE — Addendum Note (Signed)
Addended by: Alysia Penna L on: 03/27/2021 01:04 PM   Modules accepted: Orders

## 2021-03-29 ENCOUNTER — Other Ambulatory Visit: Payer: Self-pay | Admitting: Nurse Practitioner

## 2021-03-29 DIAGNOSIS — B353 Tinea pedis: Secondary | ICD-10-CM

## 2021-03-29 DIAGNOSIS — B351 Tinea unguium: Secondary | ICD-10-CM

## 2021-03-30 ENCOUNTER — Telehealth: Payer: Self-pay | Admitting: Nurse Practitioner

## 2021-03-30 NOTE — Telephone Encounter (Signed)
Spoke to pharmacy and they have spoken to him and was advised that it is ready for him to pick up.  Dm/cma

## 2021-04-02 ENCOUNTER — Telehealth: Payer: Self-pay | Admitting: Nurse Practitioner

## 2021-04-02 NOTE — Telephone Encounter (Signed)
Pt called wanting to discuss the atomoxetine (STRATTERA) 40 MG capsule, he said that he is having issues in the bedroom when taking this medication

## 2021-04-02 NOTE — Telephone Encounter (Signed)
Would you like the patient to schedule an appointment in regards to this?

## 2021-04-08 ENCOUNTER — Other Ambulatory Visit: Payer: Self-pay

## 2021-04-08 DIAGNOSIS — R41840 Attention and concentration deficit: Secondary | ICD-10-CM

## 2021-04-08 NOTE — Telephone Encounter (Signed)
Appointment scheduled and referral placed.

## 2021-04-28 ENCOUNTER — Other Ambulatory Visit: Payer: Self-pay | Admitting: Nurse Practitioner

## 2021-04-28 DIAGNOSIS — B351 Tinea unguium: Secondary | ICD-10-CM

## 2021-04-28 DIAGNOSIS — B353 Tinea pedis: Secondary | ICD-10-CM

## 2021-04-28 NOTE — Telephone Encounter (Signed)
Pt said he was previously advised to take terbinafine (LAMISIL) 250 MG tablet for 3 months. He is completing 2nd month of meds. He has 3 days left.  [id number] [place]en,  - [address] Phone:  [phone]  Fax:  [phone]

## 2021-05-25 ENCOUNTER — Telehealth: Payer: Self-pay | Admitting: Nurse Practitioner

## 2021-05-25 ENCOUNTER — Ambulatory Visit: Payer: Managed Care, Other (non HMO) | Admitting: Nurse Practitioner

## 2021-05-25 NOTE — Telephone Encounter (Signed)
Pt did not show for his appointment on 05/25/21.

## 2021-06-07 NOTE — Telephone Encounter (Signed)
Pt was no show for visit 05/25/20 1st no show since re-est. No fee charged.  Mailed no show letter.
